# Patient Record
Sex: Female | Born: 1975 | Hispanic: Yes | Marital: Married | Smoking: Never smoker
Health system: Southern US, Community
[De-identification: ages and names within clinical notes are randomized; demographics above are authoritative.]

## PROBLEM LIST (undated history)

## (undated) DIAGNOSIS — F32A Depression, unspecified: Secondary | ICD-10-CM

## (undated) DIAGNOSIS — F329 Major depressive disorder, single episode, unspecified: Secondary | ICD-10-CM

## (undated) DIAGNOSIS — F419 Anxiety disorder, unspecified: Secondary | ICD-10-CM

---

## 1999-11-19 ENCOUNTER — Ambulatory Visit (HOSPITAL_COMMUNITY): Admission: RE | Admit: 1999-11-19 | Discharge: 1999-11-19 | Payer: Self-pay | Admitting: *Deleted

## 2000-04-19 ENCOUNTER — Encounter (HOSPITAL_COMMUNITY): Admission: RE | Admit: 2000-04-19 | Discharge: 2000-04-23 | Payer: Self-pay | Admitting: Obstetrics & Gynecology

## 2000-04-19 ENCOUNTER — Encounter (INDEPENDENT_AMBULATORY_CARE_PROVIDER_SITE_OTHER): Payer: Self-pay | Admitting: Specialist

## 2000-04-19 ENCOUNTER — Inpatient Hospital Stay (HOSPITAL_COMMUNITY): Admission: AD | Admit: 2000-04-19 | Discharge: 2000-04-24 | Payer: Self-pay | Admitting: *Deleted

## 2001-12-12 ENCOUNTER — Emergency Department (HOSPITAL_COMMUNITY): Admission: EM | Admit: 2001-12-12 | Discharge: 2001-12-12 | Payer: Self-pay | Admitting: *Deleted

## 2002-01-05 ENCOUNTER — Ambulatory Visit (HOSPITAL_COMMUNITY): Admission: RE | Admit: 2002-01-05 | Discharge: 2002-01-05 | Payer: Self-pay | Admitting: Family Medicine

## 2002-01-05 ENCOUNTER — Encounter: Payer: Self-pay | Admitting: Family Medicine

## 2002-11-02 ENCOUNTER — Inpatient Hospital Stay (HOSPITAL_COMMUNITY): Admission: AD | Admit: 2002-11-02 | Discharge: 2002-11-04 | Payer: Self-pay | Admitting: Obstetrics & Gynecology

## 2006-09-05 ENCOUNTER — Inpatient Hospital Stay (HOSPITAL_COMMUNITY): Admission: AD | Admit: 2006-09-05 | Discharge: 2006-09-07 | Payer: Self-pay | Admitting: Obstetrics

## 2006-10-17 ENCOUNTER — Emergency Department (HOSPITAL_COMMUNITY): Admission: EM | Admit: 2006-10-17 | Discharge: 2006-10-17 | Payer: Self-pay | Admitting: Podiatry

## 2009-02-05 ENCOUNTER — Emergency Department (HOSPITAL_COMMUNITY): Admission: EM | Admit: 2009-02-05 | Discharge: 2009-02-05 | Payer: Self-pay | Admitting: Emergency Medicine

## 2009-11-06 ENCOUNTER — Inpatient Hospital Stay (HOSPITAL_COMMUNITY): Admission: AD | Admit: 2009-11-06 | Discharge: 2009-11-08 | Payer: Self-pay | Admitting: Obstetrics

## 2010-04-09 LAB — CBC
HCT: 30.6 % — ABNORMAL LOW (ref 36.0–46.0)
HCT: 34.8 % — ABNORMAL LOW (ref 36.0–46.0)
Hemoglobin: 10.4 g/dL — ABNORMAL LOW (ref 12.0–15.0)
Hemoglobin: 11.9 g/dL — ABNORMAL LOW (ref 12.0–15.0)
MCH: 31.4 pg (ref 26.0–34.0)
MCH: 31.5 pg (ref 26.0–34.0)
MCHC: 33.9 g/dL (ref 30.0–36.0)
MCHC: 34.2 g/dL (ref 30.0–36.0)
MCV: 92.3 fL (ref 78.0–100.0)
MCV: 92.7 fL (ref 78.0–100.0)
Platelets: 161 10*3/uL (ref 150–400)
Platelets: 173 10*3/uL (ref 150–400)
RBC: 3.3 MIL/uL — ABNORMAL LOW (ref 3.87–5.11)
RBC: 3.77 MIL/uL — ABNORMAL LOW (ref 3.87–5.11)
RDW: 16.2 % — ABNORMAL HIGH (ref 11.5–15.5)
RDW: 16.4 % — ABNORMAL HIGH (ref 11.5–15.5)
WBC: 6.9 10*3/uL (ref 4.0–10.5)
WBC: 9.1 10*3/uL (ref 4.0–10.5)

## 2010-04-09 LAB — RPR: RPR Ser Ql: NONREACTIVE

## 2010-04-13 LAB — DIFFERENTIAL
Basophils Relative: 0 % (ref 0–1)
Eosinophils Absolute: 0 10*3/uL (ref 0.0–0.7)
Eosinophils Relative: 0 % (ref 0–5)
Lymphs Abs: 0.8 10*3/uL (ref 0.7–4.0)
Monocytes Relative: 3 % (ref 3–12)
Neutrophils Relative %: 89 % — ABNORMAL HIGH (ref 43–77)

## 2010-04-13 LAB — URINALYSIS, ROUTINE W REFLEX MICROSCOPIC
Glucose, UA: NEGATIVE mg/dL
Hgb urine dipstick: NEGATIVE
Ketones, ur: NEGATIVE mg/dL
Protein, ur: NEGATIVE mg/dL
Urobilinogen, UA: 0.2 mg/dL (ref 0.0–1.0)

## 2010-04-13 LAB — POCT I-STAT, CHEM 8
BUN: 13 mg/dL (ref 6–23)
Chloride: 107 mEq/L (ref 96–112)
HCT: 38 % (ref 36.0–46.0)
Potassium: 3.4 mEq/L — ABNORMAL LOW (ref 3.5–5.1)

## 2010-04-13 LAB — CBC
HCT: 38.5 % (ref 36.0–46.0)
MCHC: 34.2 g/dL (ref 30.0–36.0)
MCV: 95.7 fL (ref 78.0–100.0)
Platelets: 238 10*3/uL (ref 150–400)
WBC: 9.1 10*3/uL (ref 4.0–10.5)

## 2010-04-13 LAB — URINE MICROSCOPIC-ADD ON

## 2010-04-13 LAB — POCT PREGNANCY, URINE: Preg Test, Ur: NEGATIVE

## 2010-11-06 LAB — I-STAT 8, (EC8 V) (CONVERTED LAB)
Acid-Base Excess: 1
HCT: 38
Operator id: 257131
Potassium: 3.1 — ABNORMAL LOW
Sodium: 139
TCO2: 26
pH, Ven: 7.433 — ABNORMAL HIGH

## 2010-11-06 LAB — URINALYSIS, ROUTINE W REFLEX MICROSCOPIC
Nitrite: NEGATIVE
Specific Gravity, Urine: 1.01
pH: 6

## 2010-11-06 LAB — URINE MICROSCOPIC-ADD ON

## 2010-11-06 LAB — URINE CULTURE

## 2010-11-10 LAB — CBC
HCT: 31.7 — ABNORMAL LOW
HCT: 35.7 — ABNORMAL LOW
Hemoglobin: 10.6 — ABNORMAL LOW
MCHC: 33.4
MCV: 88
Platelets: 204
RDW: 16 — ABNORMAL HIGH
WBC: 5.8

## 2012-12-26 ENCOUNTER — Encounter (HOSPITAL_COMMUNITY): Payer: Self-pay | Admitting: Emergency Medicine

## 2012-12-26 ENCOUNTER — Emergency Department (HOSPITAL_COMMUNITY)
Admission: EM | Admit: 2012-12-26 | Discharge: 2012-12-26 | Disposition: A | Discharge status: Home / Self Care | Payer: Self-pay | Attending: Emergency Medicine | Admitting: Emergency Medicine

## 2012-12-26 DIAGNOSIS — R110 Nausea: Secondary | ICD-10-CM | POA: Insufficient documentation

## 2012-12-26 DIAGNOSIS — H1132 Conjunctival hemorrhage, left eye: Secondary | ICD-10-CM

## 2012-12-26 DIAGNOSIS — H1130 Conjunctival hemorrhage, unspecified eye: Secondary | ICD-10-CM | POA: Insufficient documentation

## 2012-12-26 MED ORDER — TETRACAINE HCL 0.5 % OP SOLN
1.0000 [drp] | Freq: Once | OPHTHALMIC | Status: AC
  Administered 2012-12-26: 1 [drp] via OPHTHALMIC
  Filled 2012-12-26: qty 2

## 2012-12-26 MED ORDER — FLUORESCEIN SODIUM 1 MG OP STRP
ORAL_STRIP | OPHTHALMIC | Status: AC
  Administered 2012-12-26: 1 via OPHTHALMIC
  Filled 2012-12-26: qty 1

## 2012-12-26 MED ORDER — FLUORESCEIN SODIUM 1 MG OP STRP
1.0000 | ORAL_STRIP | Freq: Once | OPHTHALMIC | Status: AC
  Administered 2012-12-26: 1 via OPHTHALMIC

## 2012-12-26 NOTE — ED Notes (Signed)
Eye pain and reddness x 2 days

## 2012-12-26 NOTE — ED Provider Notes (Signed)
CSN: [id number]     Arrival date & time 12/26/12  1478 History   First MD Initiated Contact with Patient 12/26/12 0848     Chief Complaint  Patient presents with  . Eye Pain   (Consider location/radiation/quality/duration/timing/severity/associated sxs/prior Treatment) HPI Comments: Patient is a 37 year old female who presents today with eye pain and redness since yesterday morning. She reports that she woke up and her left eye was very red. There was no trauma to the eye. She has associated aching pain around the eye as well as photophobia and blurry vision in the left eye. She does not wear glasses or contacts. There is no recent eye surgery. She has never had pain like this in the past. She does have associated nausea. No fever, chills, vomiting, abdominal pain  The history is provided by the patient. No language interpreter was used.    History reviewed. No pertinent past medical history. No past surgical history on file. No family history on file. History  Substance Use Topics  . Smoking status: Never Smoker   . Smokeless tobacco: Not on file  . Alcohol Use: No   OB History   Grav Para Term Preterm Abortions TAB SAB Ect Mult Living                 Review of Systems  Constitutional: Negative for fever and chills.  Eyes: Positive for photophobia, pain, discharge, redness and visual disturbance. Negative for itching.  Respiratory: Negative for shortness of breath.   Cardiovascular: Negative for chest pain.  Gastrointestinal: Positive for nausea. Negative for vomiting and abdominal pain.  All other systems reviewed and are negative.    Allergies  Review of patient's allergies indicates no known allergies.  Home Medications   Current Outpatient Rx  Name  Route  Sig  Dispense  Refill  . ibuprofen (ADVIL,MOTRIN) 200 MG tablet   Oral   Take 200 mg by mouth every 6 (six) hours as needed for mild pain.          BP 128/78  Pulse 67  Temp(Src) 98.5 F (36.9 C) (Oral)   Resp 16  SpO2 100% Physical Exam  Nursing note and vitals reviewed. Constitutional: She is oriented to person, place, and time. She appears well-developed and well-nourished. No distress.  HENT:  Head: Normocephalic and atraumatic.    Right Ear: External ear normal.  Left Ear: External ear normal.  Nose: Nose normal.  Mouth/Throat: Oropharynx is clear and moist.  No erythema, bruising, warmth  Eyes: EOM and lids are normal. Pupils are equal, round, and reactive to light. Lids are everted and swept, no foreign bodies found. Left conjunctiva has a hemorrhage.  Slit lamp exam:      The left eye shows no corneal abrasion, no corneal flare, no corneal ulcer, no foreign body, no hyphema, no hypopyon, no fluorescein uptake and no anterior chamber bulge.  Subconjuctival hemorrhage on left.  tonopen pressure left eye is 17 No fluorescein dye uptake Full EOMs without guarding No foreign body appreciated  Neck: Normal range of motion.  Cardiovascular: Normal rate, regular rhythm and normal heart sounds.   Pulmonary/Chest: Effort normal and breath sounds normal. No stridor. No respiratory distress. She has no wheezes. She has no rales.  Abdominal: Soft. She exhibits no distension.  Musculoskeletal: Normal range of motion.  Neurological: She is alert and oriented to person, place, and time. She has normal strength.  Skin: Skin is warm and dry. She is not diaphoretic. No erythema.  Psychiatric:  She has a normal mood and affect. Her behavior is normal.    ED Course  Procedures (including critical care time) Labs Review Labs Reviewed - No data to display Imaging Review No results found.  EKG Interpretation   None       MDM   1. Subconjunctival hemorrhage, left    Patient presents with a conjunctival hemorrhage to her left eye. No fluorescein dye uptake, full EOMs without guarding. Intraocular pressure is 17 in affected eye. No change in visual acuity. No foreign bodies visualized. No  concern for preseptal or orbital cellulitis, glaucoma. I used the translator phone to speak to the patient in private where she repeats that she does feel safe at home and there was no trauma to her eye. I discussed that a subconjunctival hemorrhage he is a self limited condition. She was given ophthalmology followup if her symptoms do not resolve. Reasons to return to the ED immediately were discussed. Vital signs stable for discharge. Dr. Bebe Shaggy evaluated this patient and agrees with plan. Patient / Family / Caregiver informed of clinical course, understand medical decision-making process, and agree with plan.   Mora Bellman, PA-C 12/26/12 1034

## 2012-12-26 NOTE — ED Provider Notes (Signed)
Medical screening examination/treatment/procedure(s) were conducted as a shared visit with non-physician practitioner(s) and myself.  I personally evaluated the patient during the encounter.  EKG Interpretation   None        Pt with isolated subconj hem, no other acute issues, no hyphema, stable for d/c home  Joya Gaskins, MD 12/26/12 [phone]

## 2014-06-11 ENCOUNTER — Encounter (HOSPITAL_COMMUNITY): Payer: Self-pay | Admitting: *Deleted

## 2014-06-11 ENCOUNTER — Emergency Department (HOSPITAL_COMMUNITY)
Admission: EM | Admit: 2014-06-11 | Discharge: 2014-06-11 | Disposition: A | Discharge status: Home / Self Care | Payer: No Typology Code available for payment source | Attending: Emergency Medicine | Admitting: Emergency Medicine

## 2014-06-11 DIAGNOSIS — Y998 Other external cause status: Secondary | ICD-10-CM | POA: Insufficient documentation

## 2014-06-11 DIAGNOSIS — S199XXA Unspecified injury of neck, initial encounter: Secondary | ICD-10-CM | POA: Insufficient documentation

## 2014-06-11 DIAGNOSIS — Y9389 Activity, other specified: Secondary | ICD-10-CM | POA: Diagnosis not present

## 2014-06-11 DIAGNOSIS — M62838 Other muscle spasm: Secondary | ICD-10-CM

## 2014-06-11 DIAGNOSIS — Y92410 Unspecified street and highway as the place of occurrence of the external cause: Secondary | ICD-10-CM | POA: Diagnosis not present

## 2014-06-11 DIAGNOSIS — S4991XA Unspecified injury of right shoulder and upper arm, initial encounter: Secondary | ICD-10-CM | POA: Diagnosis not present

## 2014-06-11 MED ORDER — METHOCARBAMOL 500 MG PO TABS: 500.0000 mg | ORAL_TABLET | Freq: Two times a day (BID) | ORAL | Status: DC

## 2014-06-11 NOTE — Discharge Instructions (Signed)
Take the prescribed medication as directed.  May take tylenol or motrin with this if needed. May also wish to apply heat to neck to help with soreness. Return to the ED for new or worsening symptoms.
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
## 2014-06-11 NOTE — ED Notes (Signed)
c-collar placed during triage

## 2014-06-11 NOTE — ED Notes (Signed)
Declined W/C at D/C and was escorted to lobby by RN. 
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
## 2014-06-24 ENCOUNTER — Encounter (HOSPITAL_COMMUNITY): Payer: Self-pay | Admitting: Emergency Medicine

## 2014-06-24 ENCOUNTER — Emergency Department (HOSPITAL_COMMUNITY)
Admission: EM | Admit: 2014-06-24 | Discharge: 2014-06-25 | Disposition: A | Discharge status: Home / Self Care | Payer: No Typology Code available for payment source | Attending: Emergency Medicine | Admitting: Emergency Medicine

## 2014-06-24 DIAGNOSIS — G8911 Acute pain due to trauma: Secondary | ICD-10-CM | POA: Insufficient documentation

## 2014-06-24 DIAGNOSIS — M62830 Muscle spasm of back: Secondary | ICD-10-CM

## 2014-06-24 DIAGNOSIS — M542 Cervicalgia: Secondary | ICD-10-CM | POA: Diagnosis not present

## 2014-06-24 DIAGNOSIS — Z79899 Other long term (current) drug therapy: Secondary | ICD-10-CM | POA: Diagnosis not present

## 2014-06-24 MED ORDER — TRAMADOL HCL 50 MG PO TABS: 50.0000 mg | ORAL_TABLET | Freq: Four times a day (QID) | ORAL | Status: DC | PRN

## 2014-06-24 MED ORDER — IBUPROFEN 600 MG PO TABS: 600.0000 mg | ORAL_TABLET | Freq: Four times a day (QID) | ORAL | Status: DC | PRN
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
## 2014-09-26 ENCOUNTER — Encounter (HOSPITAL_COMMUNITY): Payer: Self-pay | Admitting: Emergency Medicine

## 2014-09-26 ENCOUNTER — Emergency Department (HOSPITAL_COMMUNITY)
Admission: EM | Admit: 2014-09-26 | Discharge: 2014-09-27 | Disposition: A | Discharge status: Other Acute Inpatient Hospital | Payer: Self-pay | Attending: Emergency Medicine | Admitting: Emergency Medicine

## 2014-09-26 DIAGNOSIS — Z3202 Encounter for pregnancy test, result negative: Secondary | ICD-10-CM | POA: Insufficient documentation

## 2014-09-26 DIAGNOSIS — Y998 Other external cause status: Secondary | ICD-10-CM | POA: Insufficient documentation

## 2014-09-26 DIAGNOSIS — T404X2A Poisoning by other synthetic narcotics, intentional self-harm, initial encounter: Secondary | ICD-10-CM | POA: Insufficient documentation

## 2014-09-26 DIAGNOSIS — Y9389 Activity, other specified: Secondary | ICD-10-CM | POA: Insufficient documentation

## 2014-09-26 DIAGNOSIS — Y92002 Bathroom of unspecified non-institutional (private) residence single-family (private) house as the place of occurrence of the external cause: Secondary | ICD-10-CM | POA: Insufficient documentation

## 2014-09-26 DIAGNOSIS — F332 Major depressive disorder, recurrent severe without psychotic features: Secondary | ICD-10-CM | POA: Diagnosis present

## 2014-09-26 DIAGNOSIS — T50902A Poisoning by unspecified drugs, medicaments and biological substances, intentional self-harm, initial encounter: Secondary | ICD-10-CM

## 2014-09-26 DIAGNOSIS — R110 Nausea: Secondary | ICD-10-CM | POA: Insufficient documentation

## 2014-09-26 LAB — CBC
HCT: 40.4 % (ref 36.0–46.0)
HEMOGLOBIN: 14.3 g/dL (ref 12.0–15.0)
MCH: 33.4 pg (ref 26.0–34.0)
MCHC: 35.4 g/dL (ref 30.0–36.0)
MCV: 94.4 fL (ref 78.0–100.0)
Platelets: 198 10*3/uL (ref 150–400)
RBC: 4.28 MIL/uL (ref 3.87–5.11)
RDW: 12.6 % (ref 11.5–15.5)
WBC: 5.6 10*3/uL (ref 4.0–10.5)

## 2014-09-26 LAB — CBG MONITORING, ED: Glucose-Capillary: 85 mg/dL (ref 65–99)

## 2014-09-26 NOTE — ED Notes (Signed)
MD at bedside. 
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
## 2014-09-26 NOTE — ED Notes (Addendum)
Spoke with Poison Control at [address])  Recommendations:  -Look for drowsiness, somnolence, nausea (usual opiate OD symptoms) -IV fluids for hypotension -For respiratory depression: Narcan -Intubate early for safety if needed -Seizure precautions  -Minimum observation for 6 hours
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
## 2014-09-27 ENCOUNTER — Inpatient Hospital Stay (HOSPITAL_COMMUNITY)
Admission: AD | Admit: 2014-09-27 | Discharge: 2014-09-28 | DRG: 885 | Disposition: A | Discharge status: Home / Self Care | Payer: Federal, State, Local not specified - Other | Source: Intra-hospital | Attending: Psychiatry | Admitting: Psychiatry

## 2014-09-27 ENCOUNTER — Encounter (HOSPITAL_COMMUNITY): Payer: Self-pay | Admitting: Registered Nurse

## 2014-09-27 DIAGNOSIS — F332 Major depressive disorder, recurrent severe without psychotic features: Secondary | ICD-10-CM | POA: Diagnosis present

## 2014-09-27 DIAGNOSIS — F4323 Adjustment disorder with mixed anxiety and depressed mood: Secondary | ICD-10-CM | POA: Diagnosis present

## 2014-09-27 DIAGNOSIS — F329 Major depressive disorder, single episode, unspecified: Secondary | ICD-10-CM | POA: Diagnosis present

## 2014-09-27 HISTORY — DX: Depression, unspecified: F32.A

## 2014-09-27 HISTORY — DX: Anxiety disorder, unspecified: F41.9

## 2014-09-27 HISTORY — DX: Major depressive disorder, single episode, unspecified: F32.9

## 2014-09-27 LAB — RAPID URINE DRUG SCREEN, HOSP PERFORMED
AMPHETAMINES: NOT DETECTED
Barbiturates: NOT DETECTED
Benzodiazepines: NOT DETECTED
Cocaine: NOT DETECTED
OPIATES: NOT DETECTED
TETRAHYDROCANNABINOL: NOT DETECTED

## 2014-09-27 LAB — COMPREHENSIVE METABOLIC PANEL
ALK PHOS: 42 U/L (ref 38–126)
ALT: 16 U/L (ref 14–54)
ANION GAP: 9 (ref 5–15)
AST: 25 U/L (ref 15–41)
Albumin: 4.6 g/dL (ref 3.5–5.0)
BILIRUBIN TOTAL: 0.8 mg/dL (ref 0.3–1.2)
BUN: 12 mg/dL (ref 6–20)
CALCIUM: 9.8 mg/dL (ref 8.9–10.3)
CO2: 20 mmol/L — ABNORMAL LOW (ref 22–32)
Chloride: 110 mmol/L (ref 101–111)
Creatinine, Ser: 0.93 mg/dL (ref 0.44–1.00)
Glucose, Bld: 93 mg/dL (ref 65–99)
POTASSIUM: 3.6 mmol/L (ref 3.5–5.1)
Sodium: 139 mmol/L (ref 135–145)
TOTAL PROTEIN: 7.3 g/dL (ref 6.5–8.1)

## 2014-09-27 LAB — HCG, QUANTITATIVE, PREGNANCY

## 2014-09-27 LAB — ETHANOL: ALCOHOL ETHYL (B): 14 mg/dL — AB (ref <5)

## 2014-09-27 LAB — SALICYLATE LEVEL: Salicylate Lvl: <4 mg/dL (ref 2.8–30.0)

## 2014-09-27 LAB — MAGNESIUM: MAGNESIUM: 2.1 mg/dL (ref 1.7–2.4)

## 2014-09-27 LAB — ACETAMINOPHEN LEVEL

## 2014-09-27 MED ORDER — ALUM & MAG HYDROXIDE-SIMETH 200-200-20 MG/5ML PO SUSP: 30.0000 mL | ORAL | Status: DC | PRN

## 2014-09-27 MED ORDER — ACETAMINOPHEN 325 MG PO TABS
650.0000 mg | ORAL_TABLET | Freq: Four times a day (QID) | ORAL | Status: DC | PRN
  Administered 2014-09-28: 650 mg via ORAL
  Filled 2014-09-27: qty 2

## 2014-09-27 MED ORDER — TRAZODONE HCL 50 MG PO TABS: 50.0000 mg | ORAL_TABLET | Freq: Every evening | ORAL | Status: DC | PRN

## 2014-09-27 MED ORDER — MAGNESIUM HYDROXIDE 400 MG/5ML PO SUSP
30.0000 mL | Freq: Every day | ORAL | Status: DC | PRN
  Filled 2014-09-27: qty 30

## 2014-09-27 MED ORDER — IBUPROFEN 200 MG PO TABS
400.0000 mg | ORAL_TABLET | Freq: Once | ORAL | Status: AC
  Administered 2014-09-27: 400 mg via ORAL
  Filled 2014-09-27: qty 2

## 2014-09-27 MED ORDER — MAGNESIUM HYDROXIDE 400 MG/5ML PO SUSP: 30.0000 mL | Freq: Every day | ORAL | Status: DC | PRN

## 2014-09-27 MED ORDER — ACETAMINOPHEN 325 MG PO TABS: 650.0000 mg | ORAL_TABLET | Freq: Four times a day (QID) | ORAL | Status: DC | PRN

## 2014-09-27 MED ORDER — ONDANSETRON HCL 4 MG/2ML IJ SOLN
4.0000 mg | Freq: Once | INTRAMUSCULAR | Status: AC | PRN
  Administered 2014-09-27: 4 mg via INTRAVENOUS
  Filled 2014-09-27: qty 2

## 2014-09-27 NOTE — [facility] Suicide Risk Assessment (Signed)
[facility] Admission Suicide Risk Assessment   Nursing information obtained from:  Patient Demographic factors:  Low socioeconomic status, Unemployed Current Mental Status:  NA Loss Factors:  Financial problems / change in socioeconomic status Historical Factors:  NA Risk Reduction Factors:  Sense of responsibility to family, Living with another person, especially a relative Total Time spent with patient: 45 minutes Principal Problem: <principal problem not specified> Diagnosis:   Patient Active Problem List   Diagnosis Date Noted  . MDD (major depressive disorder), recurrent severe, without psychosis [F33.2] 09/27/2014  . Adjustment disorder with mixed anxiety and depressed mood [F43.23] 09/27/2014     Continued Clinical Symptoms:  Alcohol Use Disorder Identification Test Final Score (AUDIT): 0 The "Alcohol Use Disorders Identification Test", Guidelines for Use in Primary Care, Second Edition.  World Science writer [facility]). Score between 0-7:  no or low risk or alcohol related problems. Score between 8-15:  moderate risk of alcohol related problems. Score between 16-19:  high risk of alcohol related problems. Score 20 or above:  warrants further diagnostic evaluation for alcohol dependence and treatment.   CLINICAL FACTORS:   Depression:   Impulsivity  Psychiatric Specialty Exam: Physical Exam  ROS  Blood pressure 117/69, pulse 59, temperature 98.4 F (36.9 C), temperature source Oral, resp. rate 18, height 5' 1.5" (1.562 m), weight 58.06 kg (128 lb), SpO2 100 %.Body mass index is 23.8 kg/(m^2).   COGNITIVE FEATURES THAT CONTRIBUTE TO RISK:  Closed-mindedness, Polarized thinking and Thought constriction (tunnel vision)    SUICIDE RISK:   Minimal: No identifiable suicidal ideation.  Patients presenting with no risk factors but with morbid ruminations; may be classified as minimal risk based on the severity of the depressive symptoms  PLAN OF CARE: See Admission H and  PE  Medical Decision Making:  Review of Psycho-Social Stressors (1), Review or order clinical lab tests (1), Review of Medication Regimen & Side Effects (2) and Review of New Medication or Change in Dosage (2)  I certify that inpatient services furnished can reasonably be expected to improve the patient's condition.   Kyrus Hyde A 09/27/2014, 10:08 PM

## 2014-09-27 NOTE — ED Notes (Signed)
As per CJ with Poison Control, case is closed.
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
## 2014-09-27 NOTE — Tx Team (Signed)
**Note Jessica-Identified via Obfuscation** Initial Interdisciplinary Treatment Plan   PATIENT STRESSORS: Financial difficulties Marital or family conflict Medication change or noncompliance   PATIENT STRENGTHS: Ability for insight Average or above average intelligence Capable of independent living Communication skills General fund of knowledge Motivation for treatment/growth Physical Health   PROBLEM LIST: Problem List/Patient Goals Date to be addressed Date deferred Reason deferred Estimated date of resolution  "depression" 09/27/2014   D/c  "anxiety" 09/27/2014   D/c  Panic attacks" 09/27/2014   D/c  "suicidal thoughts" 09/27/2014   D/c                                 DISCHARGE CRITERIA:  Ability to meet basic life and health needs Improved stabilization in mood, thinking, and/or behavior Medical problems require only outpatient monitoring Motivation to continue treatment in a less acute level of care Need for constant or close observation no longer present Reduction of life-threatening or endangering symptoms to within safe limits Safe-care adequate arrangements made Verbal commitment to aftercare and medication compliance  PRELIMINARY DISCHARGE PLAN: Attend aftercare/continuing care group Attend PHP/IOP Outpatient therapy Participate in family therapy Return to previous work or school arrangements  PATIENT/FAMIILY INVOLVEMENT: This treatment plan has been presented to and reviewed with the patient, Jessica Wilkinson, and/or family member, "Jessica"Wilkinson .  The patient and family have been given the opportunity to ask questions and make suggestions.  Jessica Wilkinson 09/27/2014, 1:31 PM
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
## 2014-09-27 NOTE — Progress Notes (Signed)
Interpreter came approximately 1800.  Patient denied SI and HI.  Denied A/V hallucinations.  Denied pain.  Patient stated she believed that her husband was having an affair with one of his workers.  Husband fired the woman.  Patient found out that husband rehired the woman from a friend.  Husband admitted that he did not realize it would cause trouble between them.  Patient stated she does not trust her husband any longer, cannot believe anything he says.  Patient was tearful while talking to nurse and interpreter.  Emotional support and encouragement given patient.  Safety maintained with 15 minute checks.

## 2014-09-27 NOTE — [facility] Group Notes (Signed)
[facility] LCSW Group Therapy 09/27/2014 1:15 PM Type of Therapy: Group Therapy Participation Level: Active  Participation Quality: Attentive, Sharing and Supportive  Affect: Depressed and Flat  Cognitive: Alert and Oriented  Insight: Developing/Improving and Engaged  Engagement in Therapy: Developing/Improving and Engaged  Modes of Intervention: Activity, Clarification, Confrontation, Discussion, Education, Exploration, Limit-setting, Orientation, Problem-solving, Rapport Building, Reality Testing, Socialization and Support  Summary of Progress/Problems: Patient was attentive and engaged with speaker from [employer]. Patient was attentive to speaker while they shared their story of dealing with mental health and overcoming it. Patient expressed interest in their programs and services and received information on their agency. Patient processed ways they can relate to the speaker.   Aragon Scarantino, MSW, LCSWA Clinical Social Worker [facility] [phone]   

## 2014-09-27 NOTE — ED Notes (Addendum)
Patient states she is unable to void.  Nurse beth advised.
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
## 2014-09-27 NOTE — ED Notes (Signed)
TTS in progress 
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
## 2014-09-27 NOTE — ED Notes (Signed)
Interpreter phone utilized to inform patient she is going to [facility] for behavioral health evaluation today. Husband is aware of patient's transfer. Has silver colored earrings, one black phone and clothes -all sent to RN in [employer]. No other c/c per patient.

## 2014-09-27 NOTE — Progress Notes (Signed)
Adult Psychoeducational Group Note  Date:  09/27/2014 Time:  10:02 PM  Group Topic/Focus:  Wrap-Up Group:   The focus of this group is to help patients review their daily goal of treatment and discuss progress on daily workbooks.  Participation Level:  Active  Participation Quality:  Appropriate  Affect:  Appropriate  Cognitive:  Alert  Insight: Appropriate  Engagement in Group:  Engaged  Modes of Intervention:  Discussion  Additional Comments:  On a scale of 1-10, 1 (worse) 10 (best) patient rated her day between an 8 and 9. Patient stated she met a lot of people, and enjoyed her visitors today.   Jessica Wilkinson [name] 09/27/2014, 10:02 PM

## 2014-09-27 NOTE — ED Notes (Signed)
Bed: [facility] Expected date:  Expected time:  Means of arrival:  Comments: Recess B

## 2014-09-28 DIAGNOSIS — F4323 Adjustment disorder with mixed anxiety and depressed mood: Secondary | ICD-10-CM

## 2014-09-28 MED ORDER — TRAZODONE HCL 50 MG PO TABS: 50.0000 mg | ORAL_TABLET | Freq: Every evening | ORAL | Status: AC | PRN

## 2014-09-28 NOTE — Progress Notes (Signed)
Nutrition Education Note  Pt attended group focusing on general, healthful nutrition education.  RD emphasized the importance of eating regular meals and snacks throughout the day. Consuming sugar-free beverages and incorporating fruits and vegetables into diet when possible. Provided examples of healthy snacks. Patient encouraged to leave group with a goal to improve nutrition/healthy eating.   Diet Order: Diet regular Room service appropriate?: Yes; Fluid consistency:: Thin Pt is also offered choice of unit snacks mid-morning and mid-afternoon.  Pt is eating as desired.   If additional nutrition issues arise, please consult RD.     Jacier Gladu, RD, LDN Inpatient Clinical Dietitian Pager # [phone] After hours/weekend pager # [phone]     

## 2014-09-28 NOTE — Plan of Care (Signed)
Problem: Ineffective individual coping Goal: LTG: Patient will report a decrease in negative feelings Outcome: Progressing Pt reports no SI today and / or feelings of self harm today.

## 2014-09-28 NOTE — Progress Notes (Signed)
  [facility] Adult Case Management Discharge Plan :  Will you be returning to the same living situation after discharge:  Yes,  patient will return home with husband At discharge, do you have transportation home?: Yes,  Husband will provide transportation Do you have the ability to pay for your medications: Yes,  patient will be provided with any necessary prescriptions  Release of information consent forms completed and in the chart;  Patient's signature needed at discharge.  Patient to Follow up at: Follow-up Information    Follow up with [facility].   Specialty:  Professional Counselor   Why:  Walk in appointment Monday-Friday between 8am to 12pm for assessment for therapy and medication management services. Spanish speaking interpreters available.    Contact information:   [facility] [place] [address] [place] [address] [phone]       Patient denies SI/HI: Yes,  denies    Safety Planning and Suicide Prevention discussed: Yes, with patient  Have you used any form of tobacco in the last 30 days? (Cigarettes, Smokeless Tobacco, Cigars, and/or Pipes): No  Has patient been referred to the Quitline?: N/A patient is not a smoker  Jessica Wilkinson L 09/28/2014, 11:41 AM

## 2014-09-28 NOTE — BHH Counselor (Signed)
PSA not completed as patient discharged prior to 72 hours.   Shemaiah Round, MSW, LCSWA Clinical Social Worker [facility] [phone]  
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
## 2014-09-28 NOTE — Progress Notes (Signed)
D: Pt who present with a flat affect is alert and oriented x4. Pt communicating through a Spanish interpreter denies any form of depression, anxiety, pain, SI, HI and AVH. She states, "I feel better happy now; my husband and children are almost here to visit." Pt with no h/o of physical and psychological health problems verbalizes regret for overdosing. She states, "I was too selfish for taking the pills; my kids should always come first in everything I do." Pt was continuously calm and pleasant through the shift assessment.   A: Pt attend group. Support, encouragement, and safe environment provided.  15-minute safety checks continue.  R: No sign of any acute distress. Safety checks continue.

## 2014-09-28 NOTE — [facility] Suicide Risk Assessment (Signed)
[facility] INPATIENT:  Family/Significant Other Suicide Prevention Education  Suicide Prevention Education:  Education Completed; Husband Burna Forts [phone],  (name of family member/significant other) has been identified by the patient as the family member/significant other with whom the patient will be residing, and identified as the person(s) who will aid the patient in the event of a mental health crisis (suicidal ideations/suicide attempt).  With written consent from the patient, the family member/significant other has been provided the following suicide prevention education, prior to the and/or following the discharge of the patient.  The suicide prevention education provided includes the following:  Suicide risk factors  Suicide prevention and interventions  National Suicide Hotline telephone number  [facility] assessment telephone number  [facility] Emergency Assistance 911  [facility] and/or Residential Mobile Crisis Unit telephone number  Request made of family/significant other to:  Remove weapons (e.g., guns, rifles, knives), all items previously/currently identified as safety concern.    Remove drugs/medications (over-the-counter, prescriptions, illicit drugs), all items previously/currently identified as a safety concern.  The family member/significant other verbalizes understanding of the suicide prevention education information provided.  The family member/significant other agrees to remove the items of safety concern listed above.  Jessica Wilkinson, Jessica Wilkinson 09/28/2014, 11:25 AM

## 2014-09-28 NOTE — Discharge Summary (Signed)
Physician Discharge Summary Note  Patient:  Jessica Wilkinson is an 39 y.o., female MRN:  [id number] DOB:  [birth year] Patient phone:  [phone] (home)  Patient address:   [address] [place] [address],  Total Time spent with patient: 45 minutes  Date of Admission:  09/27/2014 Date of Discharge: 09/28/2014  Reason for Admission:    Principal Problem: Adjustment disorder with mixed anxiety and depressed mood Discharge Diagnoses: Patient Active Problem List   Diagnosis Date Noted  . MDD (major depressive disorder), recurrent severe, without psychosis [F33.2] 09/27/2014  . Adjustment disorder with mixed anxiety and depressed mood [F43.23] 09/27/2014    Musculoskeletal: Strength & Muscle Tone: within normal limits Gait & Station: normal Patient leans: N/A  Psychiatric Specialty Exam: Physical Exam  Vitals reviewed.   ROS  Blood pressure 118/76, pulse 56, temperature 98.9 F (37.2 C), temperature source Oral, resp. rate 20, height 5' 1.5" (1.562 m), weight 58.06 kg (128 lb), SpO2 100 %.Body mass index is 23.8 kg/(m^2).   General Appearance: Fairly Groomed  Patent attorney:: Fair  Speech: Clear and Coherent  Volume: Normal  Mood: Euthymic  Affect: Appropriate  Thought Process: Coherent and Goal Directed  Orientation: Full (Time, Place, and Person)  Thought Content: plans as she moves on  Suicidal Thoughts: No  Homicidal Thoughts: No  Memory: Immediate; Fair Recent; Fair Remote; Fair  Judgement: Fair  Insight: Present  Psychomotor Activity: Normal  Concentration: Fair  Recall: [employer] of Knowledge:Fair  Language: Fair  Akathisia: No  Handed: Right  AIMS (if indicated):    Assets: Desire for Improvement Housing  Sleep: Number of Hours: 6.75  Cognition: WNL  ADL's: Intact        Have you used any form of tobacco in the last 30 days? (Cigarettes, Smokeless Tobacco, Cigars, and/or Pipes): No  Has  this patient used any form of tobacco in the last 30 days? (Cigarettes, Smokeless Tobacco, Cigars, and/or Pipes) N/A  Past Medical History:  Past Medical History  Diagnosis Date  . Anxiety   . Depression    History reviewed. No pertinent past surgical history. Family History: History reviewed. No pertinent family history. Social History:  History  Alcohol Use No    Comment: denied all alcohol     History  Drug Use No    Comment: denied all drugs    Social History   Social History  . Marital Status: Married    Spouse Name: N/A  . Number of Children: N/A  . Years of Education: N/A   Social History Main Topics  . Smoking status: Never Smoker   . Smokeless tobacco: None  . Alcohol Use: No     Comment: denied all alcohol  . Drug Use: No     Comment: denied all drugs  . Sexual Activity: Yes     Comment: husband surgery   Other Topics Concern  . None   Social History Narrative   Risk to Self: Is patient at risk for suicide?: No Risk to Others:   Prior Inpatient Therapy:   Prior Outpatient Therapy:    Level of Care:  OP  Hospital Course:  Alvia Jablonski was admitted for Adjustment disorder with mixed anxiety and depressed mood and crisis management.  She was treated discharged with the medications listed below under Medication List.  Medical problems were identified and treated as needed.  Home medications were restarted as appropriate.  Improvement was monitored by observation and Jaci Rangel-Medina daily report of symptom reduction.  Emotional and mental status was monitored by daily self-inventory reports completed by Eyvonne Mechanic Rangel-Medina and clinical staff.         Stacee Rangel-Medina was evaluated by the treatment team for stability and plans for continued recovery upon discharge.  Jozlin Rangel-Medina motivation was an integral factor for scheduling further treatment.  Employment, transportation, bed availability, health status, family support, and any  pending legal issues were also considered during her hospital stay.  She was offered further treatment options upon discharge including but not limited to Residential, Intensive Outpatient, and Outpatient treatment.  Brenley Rangel-Medina will follow up with the services as listed below under Follow Up Information.     Upon completion of this admission the patient was both mentally and medically stable for discharge denying suicidal/homicidal ideation, auditory/visual/tactile hallucinations, delusional thoughts and paranoia.      Consults:  psychiatry  Significant Diagnostic Studies:  labs: per ED  Discharge Vitals:   Blood pressure 118/76, pulse 56, temperature 98.9 F (37.2 C), temperature source Oral, resp. rate 20, height 5' 1.5" (1.562 m), weight 58.06 kg (128 lb), SpO2 100 %. Body mass index is 23.8 kg/(m^2). Lab Results:   Results for orders placed or performed during the hospital encounter of 09/26/14 (from the past 72 hour(s))  CBG monitoring, ED     Status: None   Collection Time: 09/26/14 11:34 PM  Result Value Ref Range   Glucose-Capillary 85 65 - 99 mg/dL  Comprehensive metabolic panel     Status: Abnormal   Collection Time: 09/26/14 11:40 PM  Result Value Ref Range   Sodium 139 135 - 145 mmol/L   Potassium 3.6 3.5 - 5.1 mmol/L   Chloride 110 101 - 111 mmol/L   CO2 20 (L) 22 - 32 mmol/L   Glucose, Bld 93 65 - 99 mg/dL   BUN 12 6 - 20 mg/dL   Creatinine, Ser 0.93 0.44 - 1.00 mg/dL   Calcium 9.8 8.9 - 10.3 mg/dL   Total Protein 7.3 6.5 - 8.1 g/dL   Albumin 4.6 3.5 - 5.0 g/dL   AST 25 15 - 41 U/L   ALT 16 14 - 54 U/L   Alkaline Phosphatase 42 38 - 126 U/L   Total Bilirubin 0.8 0.3 - 1.2 mg/dL   GFR calc non Af Amer >60 >60 mL/min   GFR calc Af Amer >60 >60 mL/min    Comment: (NOTE) The eGFR has been calculated using the CKD EPI equation. This calculation has not been validated in all clinical situations. eGFR's persistently <60 mL/min signify possible Chronic  Kidney Disease.    Anion gap 9 5 - 15  Ethanol (ETOH)     Status: Abnormal   Collection Time: 09/26/14 11:40 PM  Result Value Ref Range   Alcohol, Ethyl (B) 14 (H) <5 mg/dL    Comment:        LOWEST DETECTABLE LIMIT FOR SERUM ALCOHOL IS 5 mg/dL FOR MEDICAL PURPOSES ONLY   Salicylate level     Status: None   Collection Time: 09/26/14 11:40 PM  Result Value Ref Range   Salicylate Lvl <6.7 2.8 - 30.0 mg/dL  Acetaminophen level     Status: Abnormal   Collection Time: 09/26/14 11:40 PM  Result Value Ref Range   Acetaminophen (Tylenol), Serum <10 (L) 10 - 30 ug/mL    Comment:        THERAPEUTIC CONCENTRATIONS VARY SIGNIFICANTLY. A RANGE OF 10-30 ug/mL MAY BE AN EFFECTIVE CONCENTRATION FOR MANY PATIENTS. HOWEVER, SOME ARE BEST TREATED  AT CONCENTRATIONS OUTSIDE THIS RANGE. ACETAMINOPHEN CONCENTRATIONS >150 ug/mL AT 4 HOURS AFTER INGESTION AND >50 ug/mL AT 12 HOURS AFTER INGESTION ARE OFTEN ASSOCIATED WITH TOXIC REACTIONS.   CBC     Status: None   Collection Time: 09/26/14 11:40 PM  Result Value Ref Range   WBC 5.6 4.0 - 10.5 K/uL   RBC 4.28 3.87 - 5.11 MIL/uL   Hemoglobin 14.3 12.0 - 15.0 g/dL   HCT 40.4 36.0 - 46.0 %   MCV 94.4 78.0 - 100.0 fL   MCH 33.4 26.0 - 34.0 pg   MCHC 35.4 30.0 - 36.0 g/dL   RDW 12.6 11.5 - 15.5 %   Platelets 198 150 - 400 K/uL  hCG, quantitative, pregnancy     Status: None   Collection Time: 09/26/14 11:40 PM  Result Value Ref Range   hCG, Beta Chain, Quant, S <1 <5 mIU/mL    Comment:          GEST. AGE      CONC.  (mIU/mL)   <=1 WEEK        5 - 50     2 WEEKS       50 - 500     3 WEEKS       100 - 10,000     4 WEEKS     1,000 - 30,000     5 WEEKS     3,500 - 115,000   6-8 WEEKS     12,000 - 270,000    12 WEEKS     15,000 - 220,000        FEMALE AND NON-PREGNANT FEMALE:     LESS THAN 5 mIU/mL   Magnesium     Status: None   Collection Time: 09/26/14 11:47 PM  Result Value Ref Range   Magnesium 2.1 1.7 - 2.4 mg/dL  Urine rapid drug  screen (hosp performed) (Not at [facility])     Status: None   Collection Time: 09/27/14  5:47 AM  Result Value Ref Range   Opiates NONE DETECTED NONE DETECTED   Cocaine NONE DETECTED NONE DETECTED   Benzodiazepines NONE DETECTED NONE DETECTED   Amphetamines NONE DETECTED NONE DETECTED   Tetrahydrocannabinol NONE DETECTED NONE DETECTED   Barbiturates NONE DETECTED NONE DETECTED    Comment:        DRUG SCREEN FOR MEDICAL PURPOSES ONLY.  IF CONFIRMATION IS NEEDED FOR ANY PURPOSE, NOTIFY LAB WITHIN 5 DAYS.        LOWEST DETECTABLE LIMITS FOR URINE DRUG SCREEN Drug Class       Cutoff (ng/mL) Amphetamine      1000 Barbiturate      200 Benzodiazepine   027 Tricyclics       741 Opiates          300 Cocaine          300 THC              50     Physical Findings: AIMS: Facial and Oral Movements Muscles of Facial Expression: None, normal Lips and Perioral Area: None, normal Jaw: None, normal Tongue: None, normal,Extremity Movements Upper (arms, wrists, hands, fingers): None, normal Lower (legs, knees, ankles, toes): None, normal, Trunk Movements Neck, shoulders, hips: None, normal, Overall Severity Severity of abnormal movements (highest score from questions above): None, normal Incapacitation due to abnormal movements: None, normal Patient's awareness of abnormal movements (rate only patient's report): No Awareness, Dental Status Current problems with teeth and/or dentures?: No Does patient usually  wear dentures?: No  CIWA:  CIWA-Ar Total: 2 COWS:  COWS Total Score: 2   See Psychiatric Specialty Exam and Suicide Risk Assessment completed by Attending Physician prior to discharge.  Discharge destination:  Home  Is patient on multiple antipsychotic therapies at discharge:  No   Has Patient had three or more failed trials of antipsychotic monotherapy by history:  No    Recommended Plan for Multiple Antipsychotic Therapies: NA     Medication List    STOP taking these  medications        acetaminophen 500 MG tablet  Commonly known as:  TYLENOL     ibuprofen 600 MG tablet  Commonly known as:  ADVIL,MOTRIN     methocarbamol 500 MG tablet  Commonly known as:  ROBAXIN     traMADol 50 MG tablet  Commonly known as:  ULTRAM      TAKE these medications      Indication   traZODone 50 MG tablet  Commonly known as:  DESYREL  Take 1 tablet (50 mg total) by mouth at bedtime as needed for sleep.   Indication:  Trouble Sleeping, Major Depressive Disorder       Follow-up Information    Follow up with [place].   Specialty:  Professional Counselor   Why:  Walk in appointment Monday-Friday between 8am to 12pm for assessment for therapy and medication management services. Spanish speaking interpreters available.    Contact information:   [facility] [place] [address] [phone]       Follow-up recommendations:  Activity:  as tol Diet:  as tol  Comments:  1.  Take all your medications as prescribed.              2.  Report any adverse side effects to outpatient provider.                       3.  Patient instructed to not use alcohol or illegal drugs while on prescription medicines.            4.  In the event of worsening symptoms, instructed patient to call 911, the crisis hotline or go to nearest emergency room for evaluation of symptoms.  Total Discharge Time: 40 min  Signed: Freda Munro May Agustin AGNP-BC 09/28/2014, 2:32 PM  I personally assessed the patient and formulated the plan Geralyn Flash A. Sabra Heck, M.D.

## 2014-09-28 NOTE — Progress Notes (Signed)
MD completed pt's DC order and DC SRA. Pt completed her daily assessment and on it she wrote she denied having SI today and she rated her depression, hopelessness and anxiety " 0/0/0/" . With her interpreter present and with interpreters assistance, this nurse reviewed pt's DC AVS, nurse explained pt is given prescription for trazadone and all belongings in pt's chart are returned to her and she signs release per policy. Pt is escorted off unit and dc is complete.

## 2014-09-28 NOTE — [facility] Group Notes (Signed)
   [facility] LCSW Aftercare Discharge Planning Group Note  09/28/2014  8:45 AM   Participation Quality: Alert, Appropriate and Oriented  Mood/Affect: Appropriate  Depression Rating: 0  Anxiety Rating: 0  Thoughts of Suicide: Pt denies SI/HI  Will you contract for safety? Yes  Current AVH: Pt denies  Plan for Discharge/Comments: Pt attended discharge planning group and actively participated in group. CSW provided pt with today's workbook. Patient reports feeling "good" this morning. She denies depression and anxiety. Patient plans to return home to follow up with outpatient services.  Transportation Means: Pt reports access to transportation  Supports: No supports mentioned at this time  Samuella Bruin, MSW, [employer] Clinical Social Worker [employer] [phone]

## 2016-06-05 ENCOUNTER — Encounter (HOSPITAL_COMMUNITY): Payer: Self-pay | Admitting: *Deleted

## 2016-06-05 DIAGNOSIS — N9489 Other specified conditions associated with female genital organs and menstrual cycle: Secondary | ICD-10-CM | POA: Insufficient documentation

## 2016-06-05 NOTE — ED Triage Notes (Signed)
The pt is c/o abd pain  Since yesterday  No n v or diarrhea  lmp 5-5

## 2016-06-06 ENCOUNTER — Emergency Department (HOSPITAL_COMMUNITY): Payer: Self-pay

## 2016-06-06 ENCOUNTER — Emergency Department (HOSPITAL_COMMUNITY)
Admission: EM | Admit: 2016-06-06 | Discharge: 2016-06-06 | Disposition: A | Discharge status: Home / Self Care | Payer: Self-pay | Attending: Emergency Medicine | Admitting: Emergency Medicine

## 2016-06-06 DIAGNOSIS — N9489 Other specified conditions associated with female genital organs and menstrual cycle: Secondary | ICD-10-CM

## 2016-06-06 LAB — URINALYSIS, ROUTINE W REFLEX MICROSCOPIC
BILIRUBIN URINE: NEGATIVE
GLUCOSE, UA: NEGATIVE mg/dL
HGB URINE DIPSTICK: NEGATIVE
Ketones, ur: NEGATIVE mg/dL
Leukocytes, UA: NEGATIVE
Nitrite: NEGATIVE
PH: 6 (ref 5.0–8.0)
Protein, ur: NEGATIVE mg/dL
SPECIFIC GRAVITY, URINE: 1.01 (ref 1.005–1.030)

## 2016-06-06 LAB — COMPREHENSIVE METABOLIC PANEL
ALBUMIN: 3.8 g/dL (ref 3.5–5.0)
ALT: 15 U/L (ref 14–54)
AST: 22 U/L (ref 15–41)
Alkaline Phosphatase: 47 U/L (ref 38–126)
Anion gap: 6 (ref 5–15)
BUN: 16 mg/dL (ref 6–20)
CO2: 24 mmol/L (ref 22–32)
Calcium: 9.3 mg/dL (ref 8.9–10.3)
Chloride: 106 mmol/L (ref 101–111)
Creatinine, Ser: 0.9 mg/dL (ref 0.44–1.00)
GFR calc Af Amer: >60 mL/min (ref >60)
GFR calc non Af Amer: >60 mL/min (ref >60)
GLUCOSE: 102 mg/dL — AB (ref 65–99)
POTASSIUM: 3.6 mmol/L (ref 3.5–5.1)
Sodium: 136 mmol/L (ref 135–145)
Total Bilirubin: 0.2 mg/dL — ABNORMAL LOW (ref 0.3–1.2)
Total Protein: 6.4 g/dL — ABNORMAL LOW (ref 6.5–8.1)

## 2016-06-06 LAB — CBC
HEMATOCRIT: 38.1 % (ref 36.0–46.0)
HEMOGLOBIN: 13.1 g/dL (ref 12.0–15.0)
MCH: 32.4 pg (ref 26.0–34.0)
MCHC: 34.4 g/dL (ref 30.0–36.0)
MCV: 94.3 fL (ref 78.0–100.0)
Platelets: 230 10*3/uL (ref 150–400)
RBC: 4.04 MIL/uL (ref 3.87–5.11)
RDW: 13.1 % (ref 11.5–15.5)
WBC: 4.8 10*3/uL (ref 4.0–10.5)

## 2016-06-06 LAB — POC URINE PREG, ED: Preg Test, Ur: NEGATIVE

## 2016-06-06 LAB — LIPASE, BLOOD: Lipase: 31 U/L (ref 11–51)

## 2016-06-06 MED ORDER — IBUPROFEN 800 MG PO TABS: 800.0000 mg | ORAL_TABLET | Freq: Three times a day (TID) | ORAL | 0 refills | Status: AC

## 2016-06-06 MED ORDER — MORPHINE SULFATE (PF) 4 MG/ML IV SOLN
4.0000 mg | Freq: Once | INTRAVENOUS | Status: AC
  Administered 2016-06-06: 4 mg via INTRAVENOUS
  Filled 2016-06-06: qty 1

## 2016-06-06 MED ORDER — IOPAMIDOL (ISOVUE-300) INJECTION 61%
INTRAVENOUS | Status: AC
  Administered 2016-06-06: 100 mL
  Filled 2016-06-06: qty 100

## 2016-06-06 NOTE — ED Notes (Addendum)
Patient returned from CT

## 2016-06-06 NOTE — Discharge Instructions (Signed)
Take the prescribed medication as directed. Follow-up with the women's clinic. Return to the ED for new or worsening symptoms.

## 2016-06-06 NOTE — ED Notes (Signed)
Patient taken to CT.

## 2016-06-06 NOTE — ED Provider Notes (Signed)
[facility]-EMERGENCY DEPT Provider Note   CSN: [id number] Arrival date & time: 06/05/16  2328     History   Chief Complaint Chief Complaint  Patient presents with  . Abdominal Pain    HPI Jessica Wilkinson is a 41 y.o. female.  The history is provided by the patient and medical records.  Abdominal Pain      41 y.o. F with hx of anxiety and depression, presenting to the ED for abdominal pain.  States pain began Thursday evening (2 days ago).  States pain began in her upper abdomen and has now fanned out to her entire abdomen.  States it feels like "a hard ball inside".  States she has not wanted to eat or drink since onset, not necessarily because pain is worse but because she does not have an appetite.  No fever, chills, sweats.  No vomiting or diarrhea.  States she has had 2 BM's since symptom onset without change.  Denies urinary symptoms or vaginal discharge.  No prior issues like this in the past.  No prior abdominal surgeries.  No medications tried PTA.  Husband reports patient's pain has worsened quite a bit since she has been here in the waiting room.  Past Medical History:  Diagnosis Date  . Anxiety   . Depression     Patient Active Problem List   Diagnosis Date Noted  . MDD (major depressive disorder), recurrent severe, without psychosis (HCC) 09/27/2014  . Adjustment disorder with mixed anxiety and depressed mood 09/27/2014    History reviewed. No pertinent surgical history.  OB History    No data available       Home Medications    Prior to Admission medications   Medication Sig Start Date End Date Taking? Authorizing Provider  traZODone (DESYREL) 50 MG tablet Take 1 tablet (50 mg total) by mouth at bedtime as needed for sleep. 09/28/14   Adonis BrookAgustin, Sheila, NP    Family History No family history on file.  Social History Social History  Substance Use Topics  . Smoking status: Never Smoker  . Smokeless tobacco: Never Used  . Alcohol use No     Comment:  denied all alcohol     Allergies   Patient has no known allergies.   Review of Systems Review of Systems  Gastrointestinal: Positive for abdominal pain.  All other systems reviewed and are negative.    Physical Exam Updated Vital Signs BP 136/85   Pulse 63   Temp 98.1 F (36.7 C) (Oral)   Resp 16   Ht 5\' 3"  (1.6 m)   Wt 58.5 kg   LMP 05/30/2016   SpO2 100%   BMI 22.85 kg/m   Physical Exam  Constitutional: She is oriented to person, place, and time. She appears well-developed and well-nourished.  HENT:  Head: Normocephalic and atraumatic.  Mouth/Throat: Oropharynx is clear and moist.  Eyes: Conjunctivae and EOM are normal. Pupils are equal, round, and reactive to light.  Neck: Normal range of motion.  Cardiovascular: Normal rate, regular rhythm and normal heart sounds.   Pulmonary/Chest: Effort normal and breath sounds normal.  Abdominal: Soft. Bowel sounds are normal. There is tenderness.  Generalized tenderness but worse in lower quadrants, holding lower abdomen with hands  Musculoskeletal: Normal range of motion.  Neurological: She is alert and oriented to person, place, and time.  Skin: Skin is warm and dry.  Psychiatric: She has a normal mood and affect.  Nursing note and vitals reviewed.    ED Treatments /  Results  Labs (all labs ordered are listed, but only abnormal results are displayed) Labs Reviewed  COMPREHENSIVE METABOLIC PANEL - Abnormal; Notable for the following:       Result Value   Glucose, Bld 102 (*)    Total Protein 6.4 (*)    Total Bilirubin 0.2 (*)    All other components within normal limits  URINALYSIS, ROUTINE W REFLEX MICROSCOPIC - Abnormal; Notable for the following:    Color, Urine STRAW (*)    All other components within normal limits  LIPASE, BLOOD  CBC  POC URINE PREG, ED    EKG  EKG Interpretation None       Radiology Ct Abdomen Pelvis W Contrast  Result Date: 06/06/2016 CLINICAL DATA:  Lower abdominal pain,  worsening for 2 days. EXAM: CT ABDOMEN AND PELVIS WITH CONTRAST TECHNIQUE: Multidetector CT imaging of the abdomen and pelvis was performed using the standard protocol following bolus administration of intravenous contrast. CONTRAST:  ISOVUE-300 IOPAMIDOL (ISOVUE-300) INJECTION 61% COMPARISON:  None. FINDINGS: Lower chest: The lung bases are clear. Hepatobiliary: No focal liver abnormality is seen. No gallstones, gallbladder wall thickening, or biliary dilatation. Pancreas: No ductal dilatation or inflammation. Spleen: Normal in size without focal abnormality. Adrenals/Urinary Tract: Normal adrenal glands. Homogeneous renal enhancement and excretion on delayed phase imaging. No hydronephrosis. No perinephric edema. Urinary bladder is physiologically distended. Stomach/Bowel: Stomach is decompressed. No bowel wall thickening, obstruction or inflammation. Normal appendix. Small to moderate stool burden. No colonic wall thickening. Vascular/Lymphatic: Abdominal aorta is normal in caliber. Prominence of both gonadal veins. No adenopathy. Reproductive: There is increased periuterine and adnexal vascularity. Mild dilatation of the ovarian veins. Normal size left ovary with small calcifications. Probable corpus luteal cyst on the left ovary. Right ovary is normal in size. Other: Small volume pelvic free fluid. No free air. Tiny fat containing umbilical hernia. Musculoskeletal: There are no acute or suspicious osseous abnormalities. IMPRESSION: Prominent periuterine and adnexal vascularity with mild dilatation of the ovarian veins. Findings suggest pelvic congestion syndrome. Left ovarian calcification, may reflect underlying dermoid. Electronically Signed   By: Rubye Oaks M.D.   On: 06/06/2016 01:58    Procedures Procedures (including critical care time)  Medications Ordered in ED Medications  morphine 4 MG/ML injection 4 mg (4 mg Intravenous Given 06/06/16 0121)  iopamidol (ISOVUE-300) 61 % injection  (100 mLs  Contrast Given 06/06/16 0142)     Initial Impression / Assessment and Plan / ED Course  I have reviewed the triage vital signs and the nursing notes.  Pertinent labs & imaging results that were available during my care of the patient were reviewed by me and considered in my medical decision making (see chart for details).  41 year old female here with abdominal pain. Began Thursday evening and has been persistent since this time. She is afebrile and nontoxic. Abdomen is soft but she has significant tenderness throughout her lower abdomen. Voluntary guarding but no peritoneal signs. Screening lab work obtained is overall reassuring. UA without signs of infection.  CT scan obtained revealing prominent periuterine and adnexal vascularity concerning for pelvic congestion syndrome. No noted free fluid. Patient denies any vaginal discharge, no concern for STD, no urinary symptoms, irregular menstrual bleeding, or other complicating factors.  Pain controlled here. Will have her start anti-inflammatories and follow up with OB/GYN.  Discussed plan with patient and husband at bedside, they acknowledged understanding and agreed with plan of care.  Return precautions given for new or worsening symptoms.  Final Clinical Impressions(s) /  ED Diagnoses   Final diagnoses:  Pelvic congestion syndrome    New Prescriptions New Prescriptions   IBUPROFEN (ADVIL,MOTRIN) 800 MG TABLET    Take 1 tablet (800 mg total) by mouth 3 (three) times daily.     Garlon Hatchet, PA-C 06/06/16 0302    Melene Plan, DO 06/06/16 [phone]

## 2016-10-19 ENCOUNTER — Encounter: Payer: Self-pay | Admitting: Obstetrics & Gynecology

## 2016-11-04 ENCOUNTER — Ambulatory Visit (INDEPENDENT_AMBULATORY_CARE_PROVIDER_SITE_OTHER): Payer: Self-pay | Admitting: Obstetrics & Gynecology

## 2016-11-04 ENCOUNTER — Encounter: Payer: Self-pay | Admitting: Obstetrics & Gynecology

## 2016-11-04 VITALS — BP 117/83 | HR 67 | Ht 63.0 in | Wt 126.4 lb

## 2016-11-04 DIAGNOSIS — N83209 Unspecified ovarian cyst, unspecified side: Secondary | ICD-10-CM

## 2016-11-04 MED ORDER — MISOPROSTOL 200 MCG PO TABS: ORAL_TABLET | ORAL | 0 refills | Status: AC

## 2016-11-04 NOTE — Progress Notes (Signed)
Patient ID: Jessica Wilkinson, female   DOB: 1975/07/18, 41 y.o.   MRN: [id number]  Chief Complaint  Patient presents with  . Referral    new gyn    HPI Jessica Wilkinson is a 41 y.o. female.   HPI MHP2 (7 and 31 yo kids) here today with the issue of LLQ pain for "months", worse in the last 3 days, no better with IBU and hot pads. Her periods have become more frequent, like 2-3 times per month, over the last 5 months.  Past Medical History:  Diagnosis Date  . Anxiety   . Depression     No past surgical history on file.  No family history on file.  Social History Social History  Substance Use Topics  . Smoking status: Never Smoker  . Smokeless tobacco: Never Used  . Alcohol use No     Comment: denied all alcohol    No Known Allergies  Current Outpatient Prescriptions  Medication Sig Dispense Refill  . ibuprofen (ADVIL,MOTRIN) 800 MG tablet Take 1 tablet (800 mg total) by mouth 3 (three) times daily. 21 tablet 0  . traZODone (DESYREL) 50 MG tablet Take 1 tablet (50 mg total) by mouth at bedtime as needed for sleep. 30 tablet 0   No current facility-administered medications for this visit.     Review of Systems Review of Systems Breathing, conversing, and ambulating normally Well nourished, well hydrated Hispanic female, no apparent distress Abd- benign  Blood pressure 117/83, pulse 67, height  (1.6 m), weight 126 lb 6 oz (57.3 kg).  Physical Exam Physical Exam  Data Reviewed IMPRESSION: Prominent periuterine and adnexal vascularity with mild dilatation of the ovarian veins. Findings suggest pelvic congestion syndrome  Assessment  pelvic pain DUB      Plan   gyn u/s         Allie Bossier 11/04/2016, 2:42 PM

## 2016-11-04 NOTE — Progress Notes (Signed)
[place] scheduled for October 18th @ 0800.  Pt notified.

## 2016-11-12 ENCOUNTER — Other Ambulatory Visit (HOSPITAL_COMMUNITY): Payer: Self-pay | Admitting: Obstetrics & Gynecology

## 2016-11-12 ENCOUNTER — Ambulatory Visit (HOSPITAL_COMMUNITY)
Admission: RE | Admit: 2016-11-12 | Discharge: 2016-11-12 | Disposition: A | Discharge status: Home / Self Care | Payer: Self-pay | Source: Ambulatory Visit | Attending: Obstetrics & Gynecology | Admitting: Obstetrics & Gynecology

## 2016-11-12 DIAGNOSIS — Z1231 Encounter for screening mammogram for malignant neoplasm of breast: Secondary | ICD-10-CM

## 2016-11-12 DIAGNOSIS — N838 Other noninflammatory disorders of ovary, fallopian tube and broad ligament: Secondary | ICD-10-CM | POA: Insufficient documentation

## 2016-11-12 DIAGNOSIS — N83209 Unspecified ovarian cyst, unspecified side: Secondary | ICD-10-CM | POA: Insufficient documentation

## 2016-12-02 ENCOUNTER — Ambulatory Visit
Admission: RE | Admit: 2016-12-02 | Discharge: 2016-12-02 | Disposition: A | Discharge status: Home / Self Care | Payer: Self-pay | Source: Ambulatory Visit | Attending: Obstetrics & Gynecology | Admitting: Obstetrics & Gynecology

## 2016-12-02 DIAGNOSIS — Z1231 Encounter for screening mammogram for malignant neoplasm of breast: Secondary | ICD-10-CM

## 2019-03-25 IMAGING — CT CT ABD-PELV W/ CM
2 of 5 series · 16 of 46 positions shown, 18 images · IV contrast (iopamidol)
Comparison: None.

CLINICAL DATA: Lower abdominal pain, worsening for 2 days.

EXAM:
CT ABDOMEN AND PELVIS WITH CONTRAST
TECHNIQUE: Multidetector CT imaging of the abdomen and pelvis was performed
using the standard protocol following bolus administration of
intravenous contrast.
CONTRAST:  100mL RHT5MW-QMM IOPAMIDOL (RHT5MW-QMM) INJECTION 61%

[Series 3: abd/ pelvis 5.0 i30f 2 · axial · 0.70mm/px · z∈[-550,-170]mm · 13 of 86 slices shown, 15 images]
[im 5/86  soft-tissue]
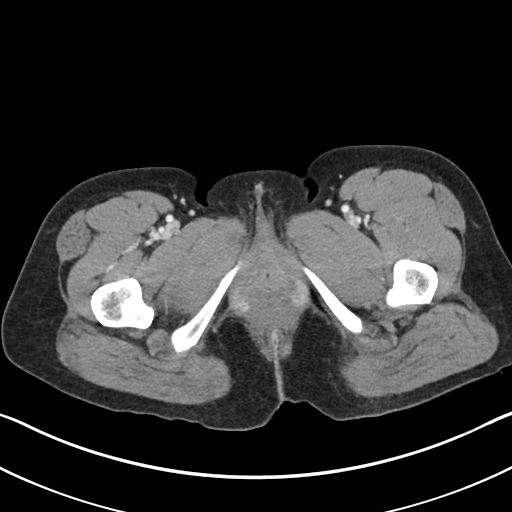
[im 5/86  bone]
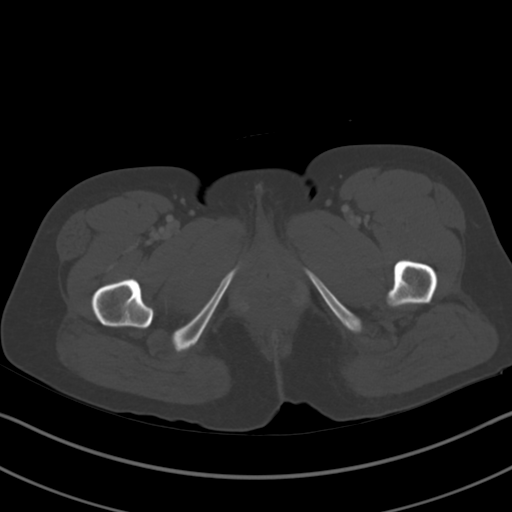
[im 10/86  soft-tissue]
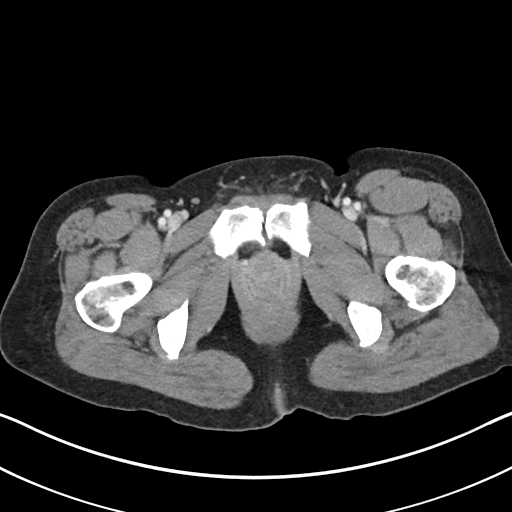
[im 19/86  soft-tissue]
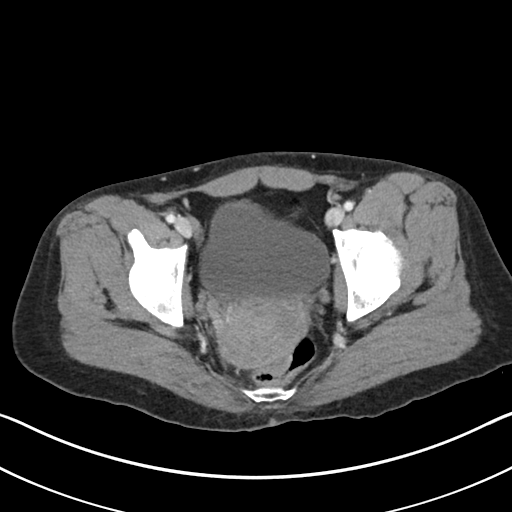
[im 24/86  soft-tissue]
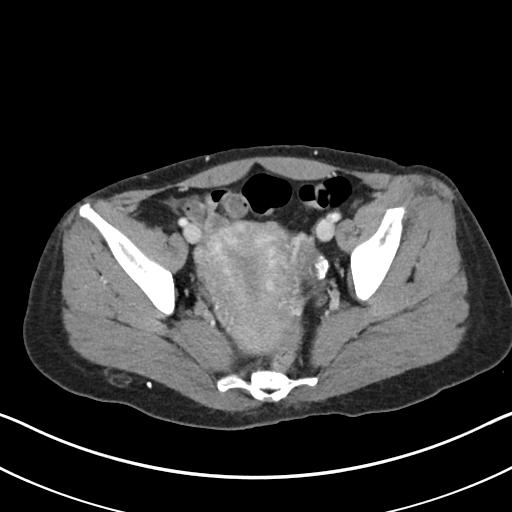
[im 29/86  soft-tissue]
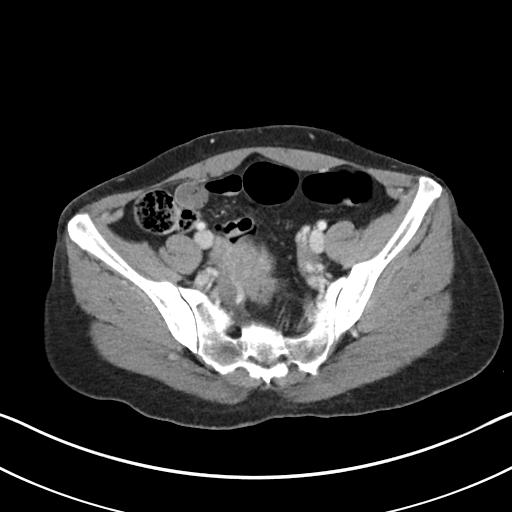
[im 38/86  soft-tissue]
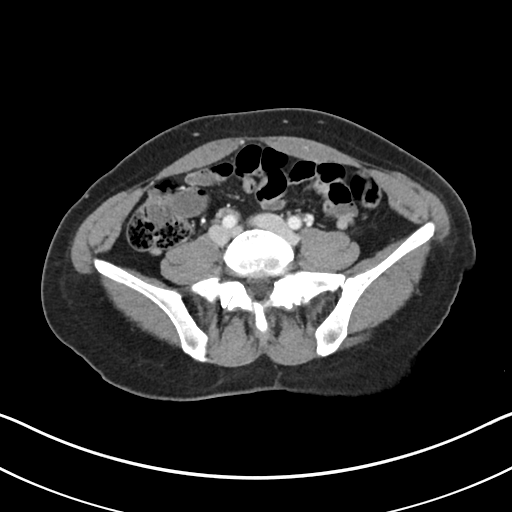
[im 43/86  soft-tissue]
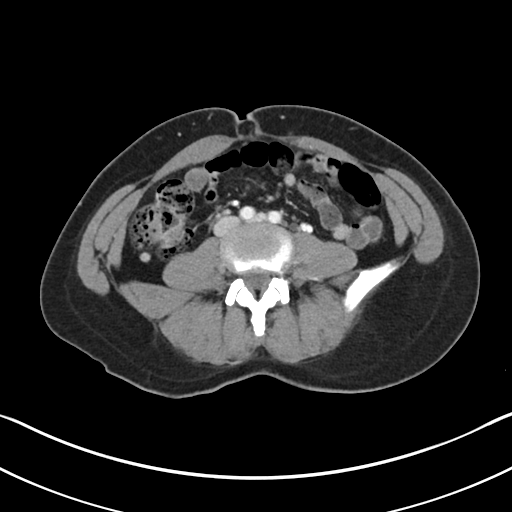
[im 48/86  soft-tissue]
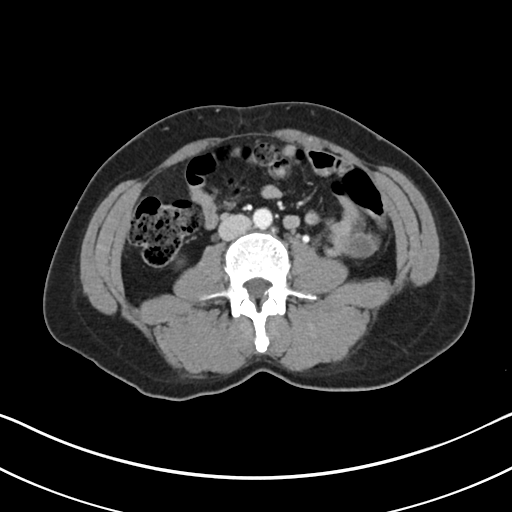
[im 57/86  soft-tissue]
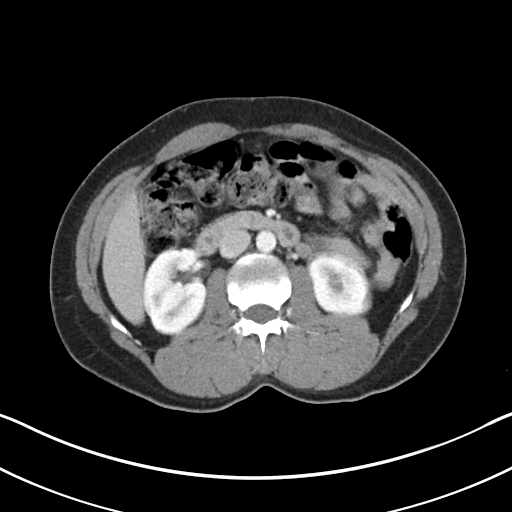
[im 57/86  bone]
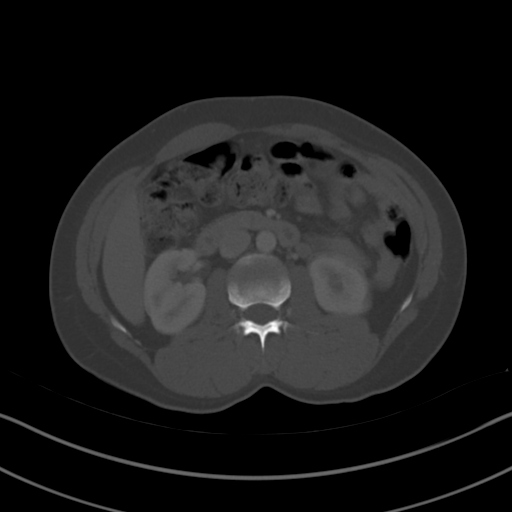
[im 62/86  soft-tissue]
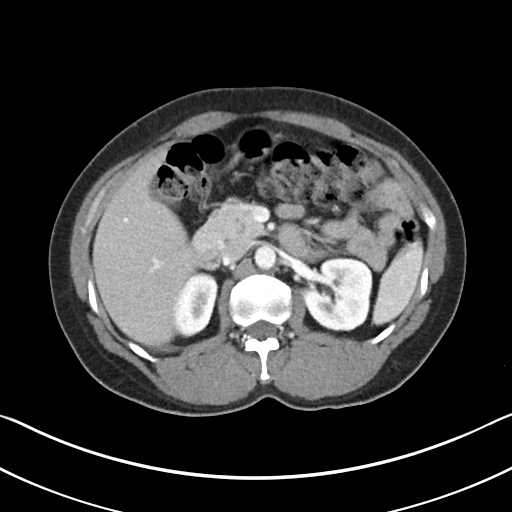
[im 67/86  soft-tissue]
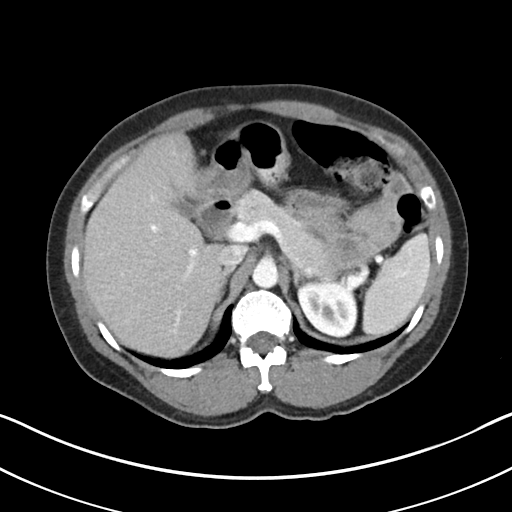
[im 76/86  soft-tissue]
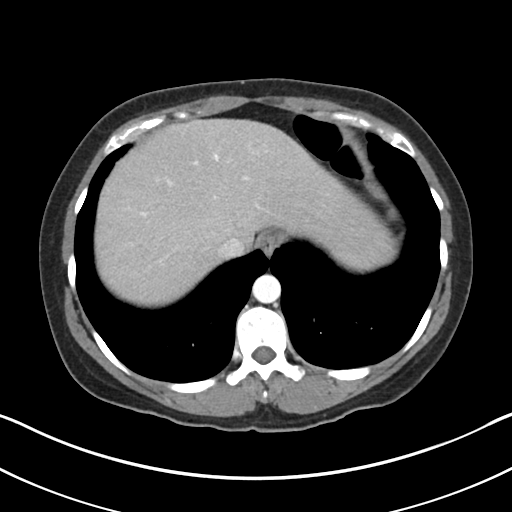
[im 81/86  soft-tissue]
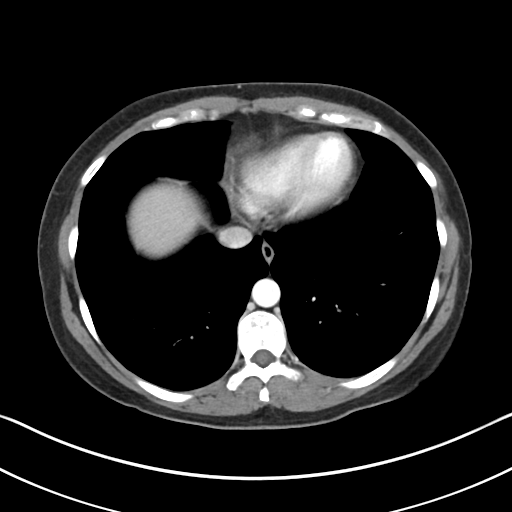

[Series 5: coronal soft tissue · coronal · 0.68mm/px · 3 of 87 slices shown]
[im 29/87  soft-tissue]
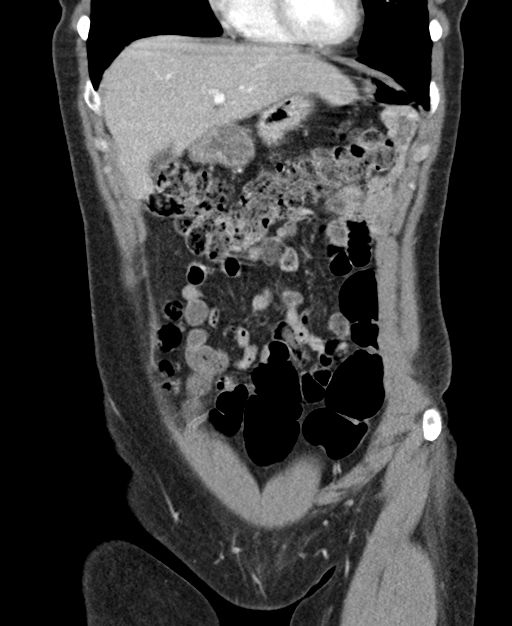
[im 39/87  soft-tissue]
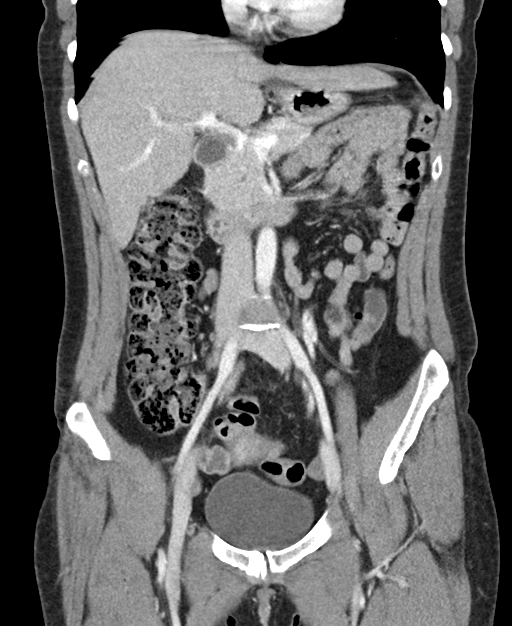
[im 48/87  soft-tissue]
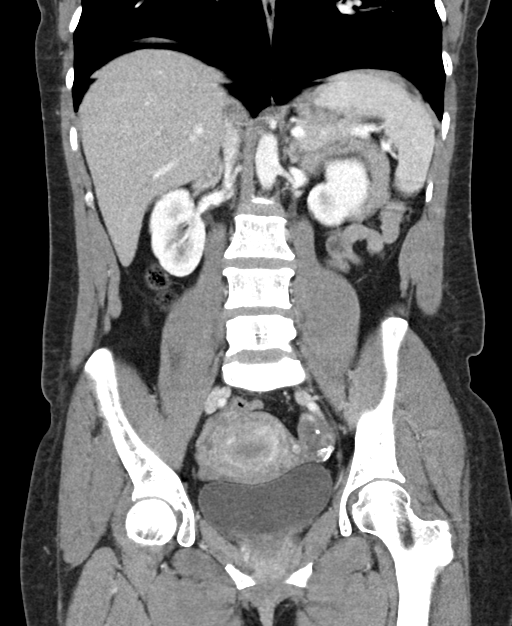

[16 of 46 positions shown; findings below may reference images not displayed]

FINDINGS: Lower chest: The lung bases are clear.

Hepatobiliary: No focal liver abnormality is seen. No gallstones,
gallbladder wall thickening, or biliary dilatation.

Pancreas: No ductal dilatation or inflammation.

Spleen: Normal in size without focal abnormality.

Adrenals/Urinary Tract: Normal adrenal glands. Homogeneous renal
enhancement and excretion on delayed phase imaging. No
hydronephrosis. No perinephric edema. Urinary bladder is
physiologically distended.

Stomach/Bowel: Stomach is decompressed. No bowel wall thickening,
obstruction or inflammation. Normal appendix. Small to moderate
stool burden. No colonic wall thickening.

Vascular/Lymphatic: Abdominal aorta is normal in caliber. Prominence
of both gonadal veins. No adenopathy.

Reproductive: There is increased periuterine and adnexal
vascularity. Mild dilatation of the ovarian veins. Normal size left
ovary with small calcifications. Probable corpus luteal cyst on the
left ovary. Right ovary is normal in size.

Other: Small volume pelvic free fluid. No free air. Tiny fat
containing umbilical hernia.

Musculoskeletal: There are no acute or suspicious osseous
abnormalities.
IMPRESSION: Prominent periuterine and adnexal vascularity with mild dilatation
of the ovarian veins. Findings suggest pelvic congestion syndrome.

Left ovarian calcification, may reflect underlying dermoid.

## 2019-08-31 IMAGING — US US PELVIS COMPLETE
1 series · 15 of 25 positions shown · non-contrast
Comparison: CT 06/06/2016

CLINICAL DATA: Left lower quadrant pain, irregular cycles

EXAM:
TRANSABDOMINAL AND TRANSVAGINAL ULTRASOUND OF PELVIS
TECHNIQUE: Both transabdominal and transvaginal ultrasound examinations of the
pelvis were performed. Transabdominal technique was performed for
global imaging of the pelvis including uterus, ovaries, adnexal
regions, and pelvic cul-de-sac. It was necessary to proceed with
endovaginal exam following the transabdominal exam to visualize the
uterus, endometrium, ovaries and adnexa .

[Series 1: us pelvis complete · 73 acquisitions, 15 frames shown]
[im 1/73]
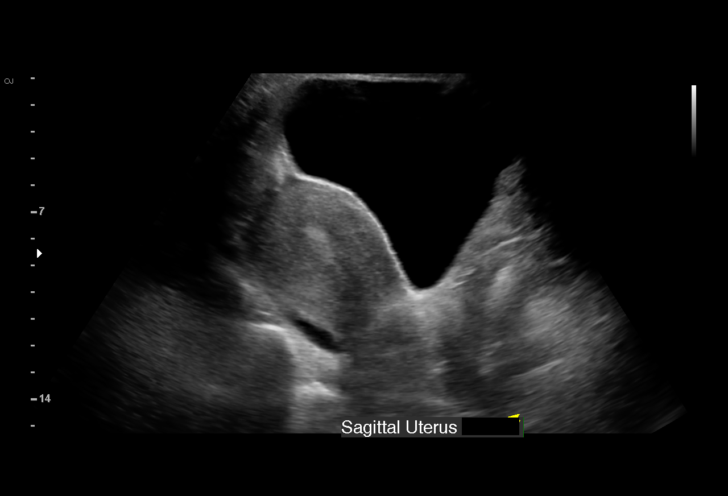
[im 7/73]
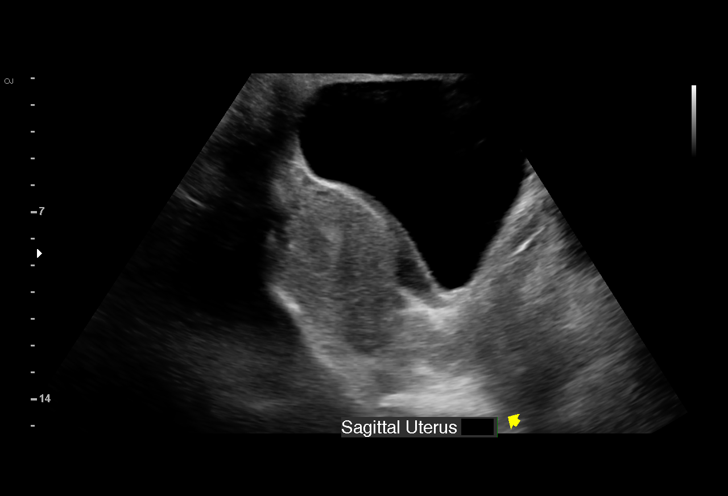
[im 13/73]
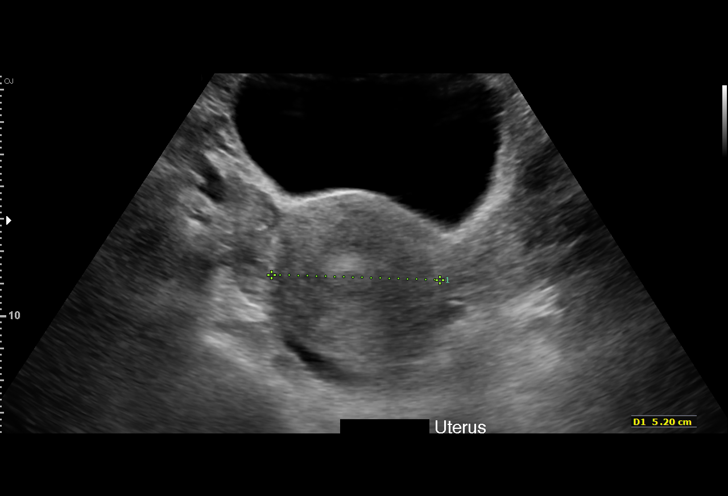
[im 16/73]
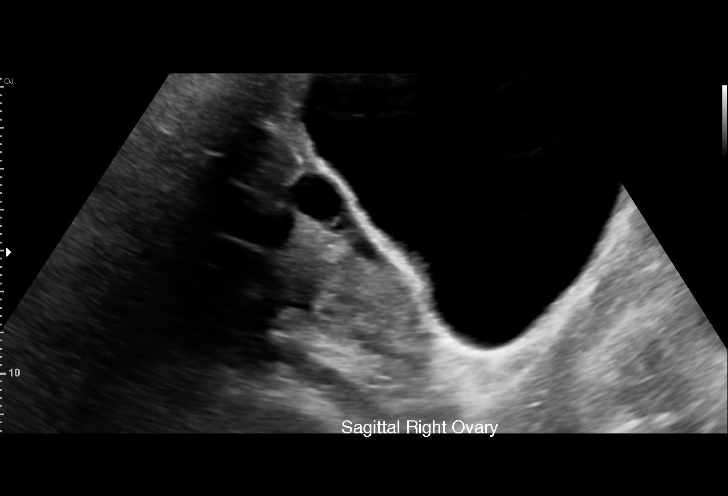
[im 22/73]
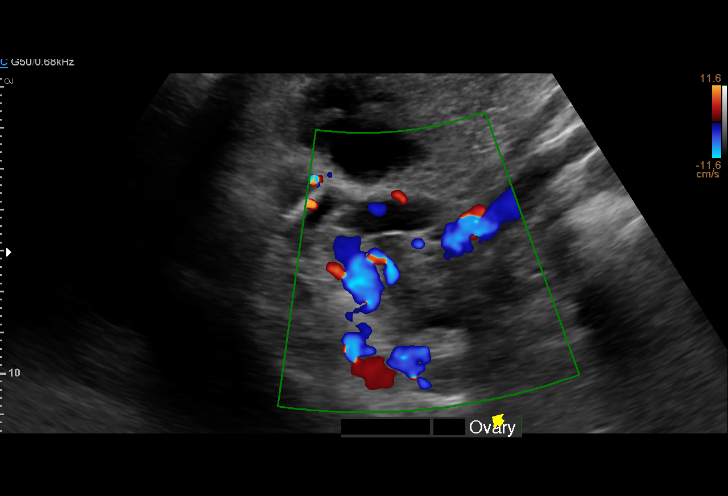
[im 28/73]
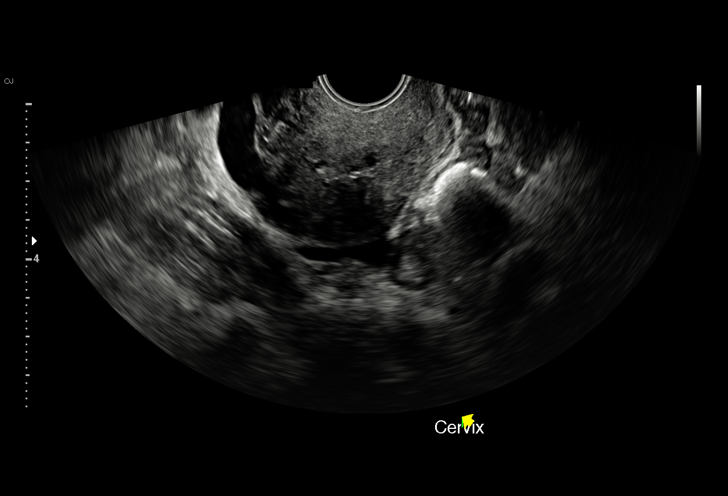
[im 31/73]
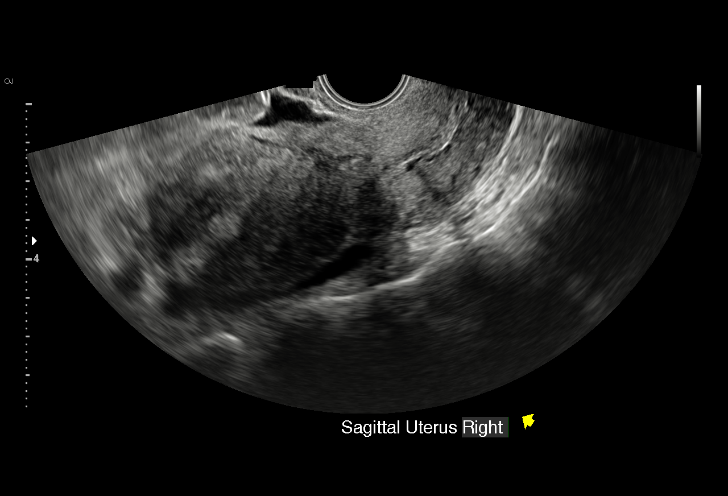
[im 37/73]
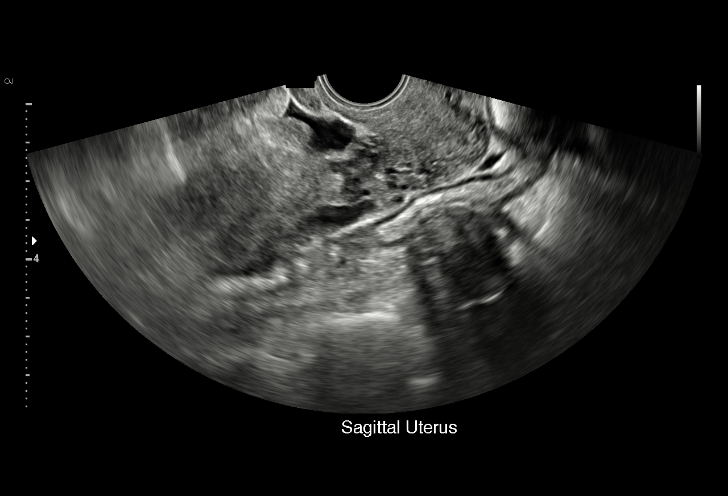
[im 43/73]
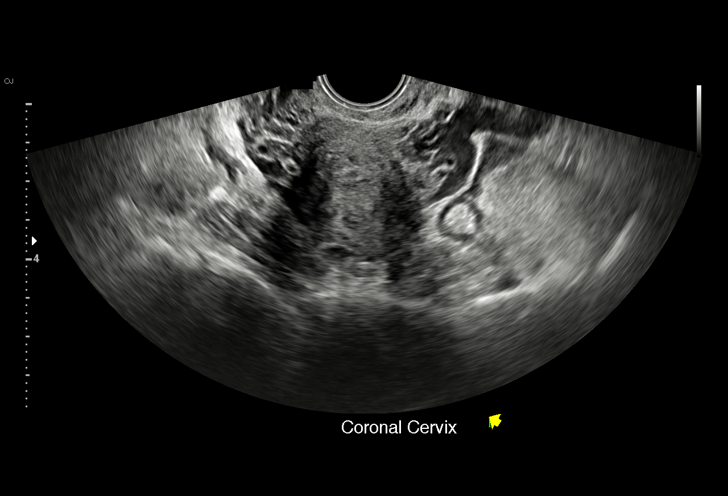
[im 46/73]
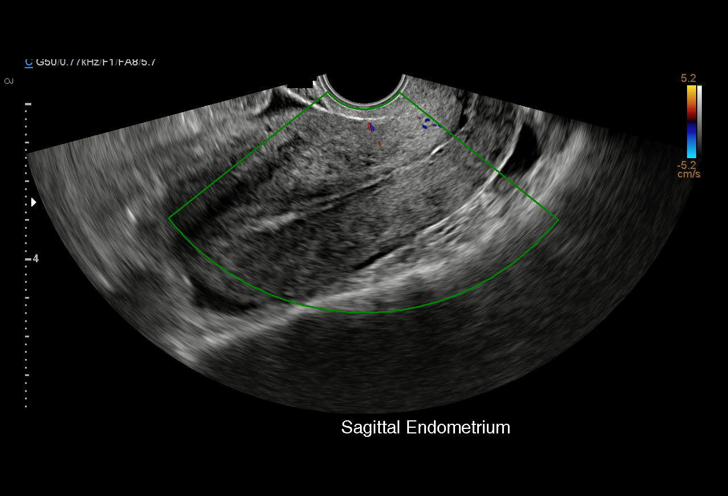
[im 52/73]
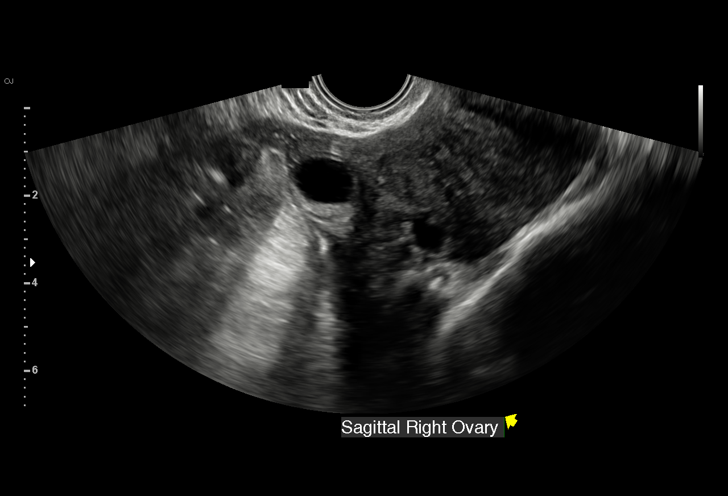
[im 58/73]
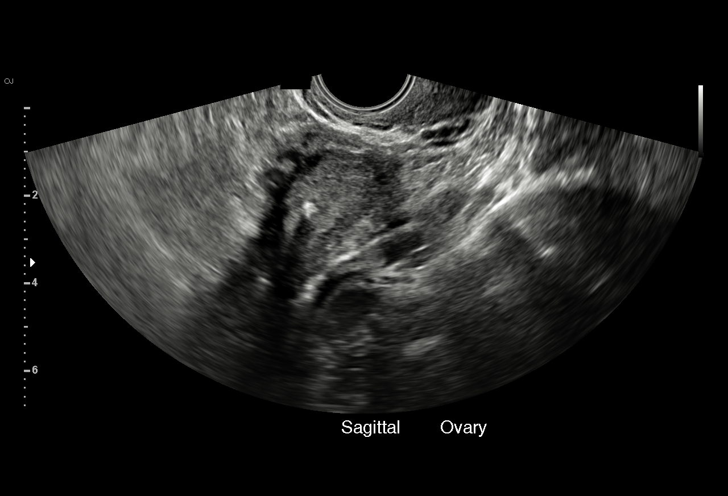
[im 61/73]
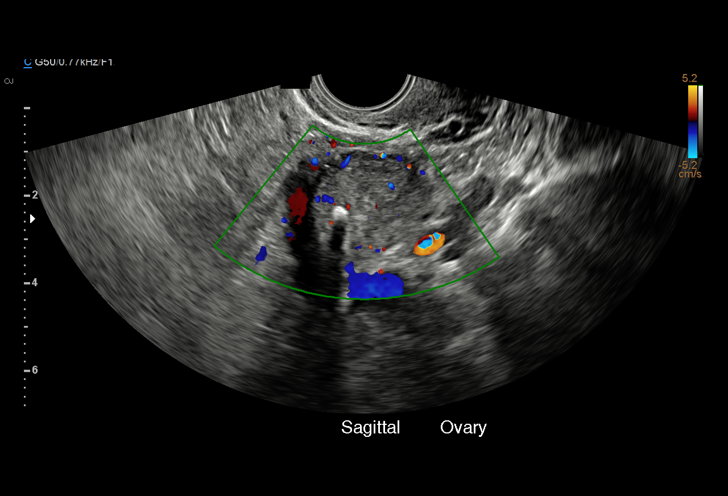
[im 67/73]
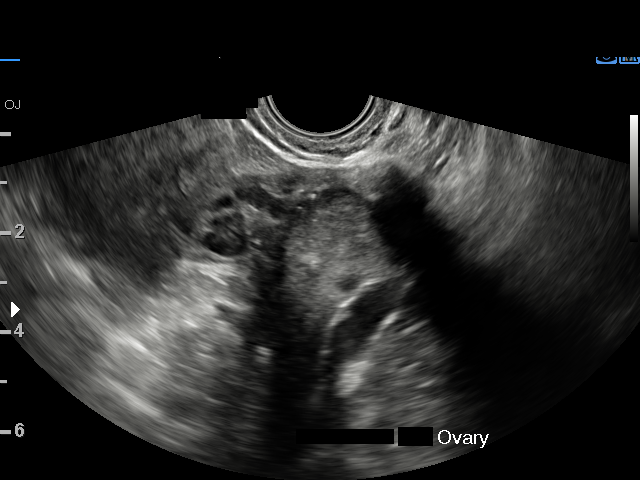
[im 73/73]
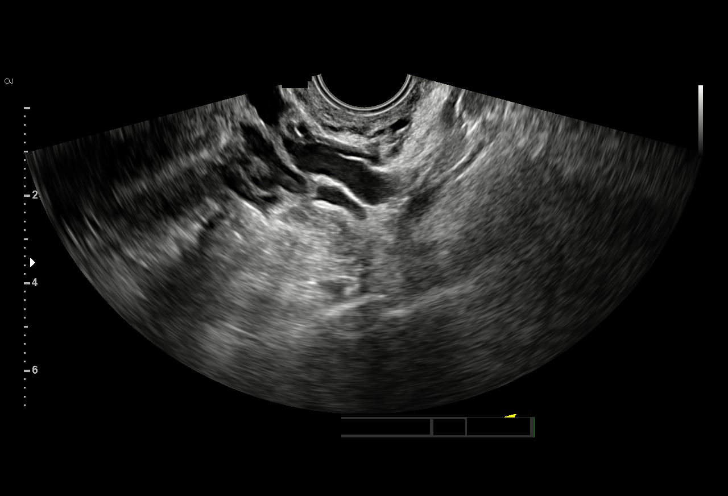

[15 of 25 positions shown; findings below may reference images not displayed]

FINDINGS: Uterus

Measurements: 10.8 x 4.9 x 6.1 cm. No fibroids or other mass
visualized.

Endometrium

Thickness: Normal thickness, 7 mm.  No focal abnormality visualized.

Right ovary

Measurements: 2.6 x 2.0 x 1.9 cm. Normal appearance/no adnexal mass.

Left ovary

Measurements: 3.0 x 2.8 x 2.2 cm. Calcification noted in the left
ovary as seen on prior CT. No adnexal mass.

Other findings

No abnormal free fluid.
IMPRESSION: No acute or significant abnormality within the pelvis.
